# Patient Record
Sex: Male | Born: 1990 | Race: Black or African American | Hispanic: No | Marital: Single | Smoking: Never smoker
Health system: Southern US, Community
[De-identification: ages and names within clinical notes are randomized; demographics above are authoritative.]

---

## 2011-11-12 ENCOUNTER — Emergency Department (HOSPITAL_BASED_OUTPATIENT_CLINIC_OR_DEPARTMENT_OTHER)
Admission: EM | Admit: 2011-11-12 | Discharge: 2011-11-12 | Disposition: A | Discharge status: Home / Self Care | Payer: Self-pay | Attending: Emergency Medicine | Admitting: Emergency Medicine

## 2011-11-12 ENCOUNTER — Encounter (HOSPITAL_BASED_OUTPATIENT_CLINIC_OR_DEPARTMENT_OTHER): Payer: Self-pay | Admitting: Family Medicine

## 2011-11-12 DIAGNOSIS — L738 Other specified follicular disorders: Secondary | ICD-10-CM | POA: Insufficient documentation

## 2011-11-12 DIAGNOSIS — L739 Follicular disorder, unspecified: Secondary | ICD-10-CM

## 2011-11-12 MED ORDER — HYDROCODONE-ACETAMINOPHEN 5-325 MG PO TABS: 2.0000 | ORAL_TABLET | ORAL | Status: AC | PRN

## 2011-11-12 MED ORDER — CEPHALEXIN 500 MG PO CAPS: 500.0000 mg | ORAL_CAPSULE | Freq: Four times a day (QID) | ORAL | Status: AC

## 2011-11-12 NOTE — ED Notes (Signed)
Pt sts he saw a "black spider" 2 days ago and thinks he may have been bitten on leg by it. Pt has reddened area to right anterior thigh.

## 2011-11-12 NOTE — Discharge Instructions (Signed)
Folliculitis  °Folliculitis is an infection and inflammation of the hair follicles. Hair follicles become red and irritated. This inflammation is usually caused by bacteria. The bacteria thrive in warm, moist environments. This condition can be seen anywhere on the body.  °CAUSES °The most common cause of folliculitis is an infection by germs (bacteria). Fungal and viral infections can also cause the condition. Viral infections may be more common in people whose bodies are unable to fight disease well (weakened immune systems). Examples include people with: °· AIDS.  °· An organ transplant.  °· Cancer.  °People with depressed immune systems, diabetes, or obesity, have a greater risk of getting folliculitis than the general population. Certain chemicals, especially oils and tars, also can cause folliculitis. °SYMPTOMS °· An early sign of folliculitis is a small, white or yellow pus-filled, itchy lesion (pustule). These lesions appear on a red, inflamed follicle. They are usually less than 5 mm (.20 inches).  °· The most likely starting points are the scalp, thighs, legs, back and buttocks. Folliculitis is also frequently found in areas of repeated shaving.  °· When an infection of the follicle goes deeper, it becomes a boil or furuncle. A group of closely packed boils create a larger lesion (a carbuncle). These sores (lesions) tend to occur in hairy, sweaty areas of the body.  °TREATMENT  °· A doctor who specializes in skin problems (dermatologists) treats mild cases of folliculitis with antiseptic washes.  °· They also use a skin application which kills germs (topical antibiotics). Tea tree oil is a good topical antiseptic as well. It can be found at a health food store. A small percentage of individuals may develop an allergy to the tea tree oil.  °· Mild to moderate boils respond well to warm water compresses applied three times daily.  °· In some cases, oral antibiotics should be taken with the skin treatment.    °· If lesions contain large quantities of pus or fluid, your caregiver may drain them. This allows the topical antibiotics to get to the affected areas better.  °· Stubborn cases of folliculitis may respond to laser hair removal. This process uses a high intensity light beam (a laser) to destroy the follicle and reduces the scarring from folliculitis. After laser hair removal, hair will no longer grow in the laser treated area.  °Patients with long-lasting folliculitis need to find out where the infection is coming from. Germs can live in the nostrils of the patient. This can trigger an outbreak now and then. Sometimes the bacteria live in the nostrils of a family member. This person does not develop the disorder but they repeatedly re-expose others to the germ. To break the cycle of recurrence in the patient, the family member must also undergo treatment. °PREVENTION  °· Individuals who are predisposed to folliculitis should be extremely careful about personal hygiene.  °· Application of antiseptic washes may help prevent recurrences.  °· A topical antibiotic cream, mupirocin (Bactroban®), has been effective at reducing bacteria in the nostrils. It is applied inside the nose with your little finger. This is done twice daily for a week. Then it is repeated every 6 months.  °· Because follicle disorders tend to come back, patients must receive follow-up care. Your caregiver may be able to recognize a recurrence before it becomes severe.  °SEEK IMMEDIATE MEDICAL CARE IF:  °· You develop redness, swelling, or increasing pain in the area.  °· You have a fever.  °· You are not improving with treatment   or are getting worse.  °· You have any other questions or concerns.  °Document Released: 08/10/2001 Document Revised: 05/21/2011 Document Reviewed: 06/06/2008 °[employer]® Patient Information ©2012 [employer]. °

## 2011-11-12 NOTE — ED Provider Notes (Signed)
History     CSN: [id number]  Arrival date & time 11/12/11  4098   First MD Initiated Contact with Patient 11/12/11 [phone]      Chief Complaint  Patient presents with  . Insect Bite     HPI Patient presents with tender red area on the anterior aspect of right thigh.  Patient denies fever or chills.  Has no previous medical history.  Patient was concerned it might be a spider bite because he saw a spider. History reviewed. No pertinent past medical history.  History reviewed. No pertinent past surgical history.  No family history on file.  History  Substance Use Topics  . Smoking status: Never Smoker   . Smokeless tobacco: Not on file  . Alcohol Use: Yes      Review of Systems  All other systems reviewed and are negative.    Allergies  Review of patient's allergies indicates no known allergies.  Home Medications   Current Outpatient Rx  Name Route Sig Dispense Refill  . CEPHALEXIN 500 MG PO CAPS Oral Take 1 capsule (500 mg total) by mouth 4 (four) times daily. 20 capsule 0  . HYDROCODONE-ACETAMINOPHEN 5-325 MG PO TABS Oral Take 2 tablets by mouth every 4 (four) hours as needed for pain. 6 tablet 0    BP 137/85  Pulse 62  Temp(Src) 98.1 F (36.7 C) (Oral)  Resp 16  Ht 5\' 7"  (1.702 m)  Wt 170 lb (77.111 kg)  BMI 26.63 kg/m2  SpO2 99%  Physical Exam  Nursing note and vitals reviewed. Constitutional: He is oriented to person, place, and time. He appears well-developed and well-nourished. No distress.  HENT:  Head: Normocephalic and atraumatic.  Eyes: Pupils are equal, round, and reactive to light.  Neck: Normal range of motion.  Cardiovascular: Normal rate and intact distal pulses.   Pulmonary/Chest: No respiratory distress.  Abdominal: Normal appearance. He exhibits no distension.  Musculoskeletal: Normal range of motion.       Patient has an area of the right thigh which is red and tender.  At the center of the erythema there appears to be an infected  hair follicle.  That area was prepped with chlorhexidine and small amount of pertinent material was expressed using sterile needle.  It was then dressed with triple antibiotic ointment and a topical dressing.  Neurological: He is alert and oriented to person, place, and time. No cranial nerve deficit.  Skin: Skin is warm and dry. No rash noted.  Psychiatric: He has a normal mood and affect. His behavior is normal.    ED Course  Procedures (including critical care time)  Labs Reviewed - No data to display No results found.   1. Folliculitis       MDM   Patient instructed to use hot compresses several times a day for the next 2 days and return if worse.  Plan at this time is to start him on antibiotics.      Nelia Shi, MD 11/12/11 [phone]

## 2012-05-01 ENCOUNTER — Encounter (HOSPITAL_BASED_OUTPATIENT_CLINIC_OR_DEPARTMENT_OTHER): Payer: Self-pay

## 2012-05-01 ENCOUNTER — Emergency Department (HOSPITAL_BASED_OUTPATIENT_CLINIC_OR_DEPARTMENT_OTHER)
Admission: EM | Admit: 2012-05-01 | Discharge: 2012-05-01 | Disposition: A | Discharge status: Home / Self Care | Payer: Self-pay | Attending: Emergency Medicine | Admitting: Emergency Medicine

## 2012-05-01 DIAGNOSIS — R1310 Dysphagia, unspecified: Secondary | ICD-10-CM | POA: Insufficient documentation

## 2012-05-01 DIAGNOSIS — J020 Streptococcal pharyngitis: Secondary | ICD-10-CM | POA: Insufficient documentation

## 2012-05-01 MED ORDER — PREDNISONE 50 MG PO TABS
60.0000 mg | ORAL_TABLET | Freq: Once | ORAL | Status: AC
  Administered 2012-05-01: 60 mg via ORAL
  Filled 2012-05-01: qty 1

## 2012-05-01 MED ORDER — PENICILLIN V POTASSIUM 250 MG PO TABS
500.0000 mg | ORAL_TABLET | Freq: Once | ORAL | Status: AC
  Administered 2012-05-01: 500 mg via ORAL
  Filled 2012-05-01: qty 2

## 2012-05-01 MED ORDER — PENICILLIN V POTASSIUM 500 MG PO TABS: 500.0000 mg | ORAL_TABLET | Freq: Four times a day (QID) | ORAL | Status: AC

## 2012-05-01 NOTE — ED Provider Notes (Signed)
History     CSN: [id number]  Arrival date & time 05/01/12  0502   First MD Initiated Contact with Patient 05/01/12 [phone]      Chief Complaint  Patient presents with  . Sore Throat   Patient is a 21 y.o. male presenting with pharyngitis. The history is provided by the patient.  Sore Throat This is a new problem. Episode onset: just prior to arrival. The problem occurs constantly. The problem has been gradually worsening. Pertinent negatives include no chest pain and no shortness of breath. The symptoms are aggravated by swallowing. The symptoms are relieved by rest. He has tried nothing for the symptoms.  PT reports he woke up with sore throat, difficulty swallowing.  He reports he coughed and small amt of blood was expelled.  No fever.  His neck is not stiff.  He has not had this before.  He is not on medications.  No known allergic reactions.  No rash.  No SOB.    PMH - none  History reviewed. No pertinent past surgical history.  No family history on file.  History  Substance Use Topics  . Smoking status: Never Smoker   . Smokeless tobacco: Not on file  . Alcohol Use: Yes      Review of Systems  Respiratory: Negative for shortness of breath.   Cardiovascular: Negative for chest pain.    Allergies  Review of patient's allergies indicates no known allergies.  Home Medications  No current outpatient prescriptions on file.  BP 135/72  Pulse 77  Temp 98.8 F (37.1 C) (Oral)  Resp 20  SpO2 100%  Physical Exam CONSTITUTIONAL: Well developed/well nourished HEAD AND FACE: Normocephalic/atraumatic EYES: EOMI/PERRL ENMT: Mucous membranes moist. Uvula midline and it is slightly enlarged/erythematous and clearly raises away from tongue.  He has symmetric tonsillar enlargement with erythema and whitish exudates.  No stridor.  Voice is mildly hoarse but is easily understandable. No blood noted in oropharynx NECK: supple no meningeal signs. No anterior neck edema is  noted SPINE:entire spine nontender CV: S1/S2 noted, no murmurs/rubs/gallops noted LUNGS: Lungs are clear to auscultation bilaterally, no apparent distress ABDOMEN: soft, nontender, no rebound or guarding NEURO: Pt is awake/alert, moves all extremitiesx4 EXTREMITIES: pulses normal, full ROM SKIN: warm, color normal PSYCH: no abnormalities of mood noted    ED Course  Procedures   Labs Reviewed  RAPID STREP SCREEN  5:55 AM Pt able to tolerate liquids without difficulty.  No stridor is noted.  His neck is supple. He was give one dose of prednisone.  PCN was ordered for patient Do not feel further workup/imaging is warranted   MDM  Nursing notes including past medical history and social history reviewed and considered in documentation Labs/vital reviewed and considered         Joya Gaskins, MD 05/01/12 [phone]

## 2012-05-01 NOTE — ED Notes (Signed)
MD at bedside. 

## 2012-05-01 NOTE — ED Notes (Signed)
Patient reports that he awoke with sorethroat and noticed that his uvula was very swollen and coughed up a small amount of blood. Difficulty speaking on assessment but having no problems handling secretions

## 2012-07-14 ENCOUNTER — Encounter (HOSPITAL_BASED_OUTPATIENT_CLINIC_OR_DEPARTMENT_OTHER): Payer: Self-pay

## 2012-07-14 ENCOUNTER — Emergency Department (HOSPITAL_BASED_OUTPATIENT_CLINIC_OR_DEPARTMENT_OTHER)
Admission: EM | Admit: 2012-07-14 | Discharge: 2012-07-14 | Disposition: A | Discharge status: Home / Self Care | Payer: Self-pay | Attending: Emergency Medicine | Admitting: Emergency Medicine

## 2012-07-14 DIAGNOSIS — J039 Acute tonsillitis, unspecified: Secondary | ICD-10-CM

## 2012-07-14 DIAGNOSIS — R110 Nausea: Secondary | ICD-10-CM | POA: Insufficient documentation

## 2012-07-14 DIAGNOSIS — R5381 Other malaise: Secondary | ICD-10-CM | POA: Insufficient documentation

## 2012-07-14 DIAGNOSIS — J029 Acute pharyngitis, unspecified: Secondary | ICD-10-CM | POA: Insufficient documentation

## 2012-07-14 DIAGNOSIS — J069 Acute upper respiratory infection, unspecified: Secondary | ICD-10-CM | POA: Insufficient documentation

## 2012-07-14 MED ORDER — AMOXICILLIN 500 MG PO CAPS: 500.0000 mg | ORAL_CAPSULE | Freq: Three times a day (TID) | ORAL | Status: AC

## 2012-07-14 MED ORDER — HYDROCODONE-HOMATROPINE 5-1.5 MG/5ML PO SYRP: 5.0000 mL | ORAL_SOLUTION | Freq: Four times a day (QID) | ORAL | Status: AC | PRN

## 2012-07-14 NOTE — ED Notes (Signed)
Pt reports cough, chills and generalized body aches that started 3 days ago.

## 2012-07-14 NOTE — ED Provider Notes (Signed)
History     CSN: [id number]  Arrival date & time 07/14/12  1603   First MD Initiated Contact with Patient 07/14/12 1621      Chief Complaint  Patient presents with  . Cough  . Nausea  . Chills  . Generalized Body Aches    (Consider location/radiation/quality/duration/timing/severity/associated sxs/prior treatment) HPI Comments: Patient has been sick for 3 days with cough, fever, chills and malaise. He has had a sore throat, worse with swallowing. Patient reports that the body aches and a cough that kept him up at night. Cough is nonproductive. He has had some mild nausea without vomiting or diarrhea. No abdominal pain.  Patient is a 22 y.o. male presenting with cough.  Cough Associated symptoms include sore throat.    History reviewed. No pertinent past medical history.  History reviewed. No pertinent past surgical history.  History reviewed. No pertinent family history.  History  Substance Use Topics  . Smoking status: Never Smoker   . Smokeless tobacco: Not on file  . Alcohol Use: Yes     Comment: occasional      Review of Systems  Constitutional: Positive for fever and fatigue.  HENT: Positive for sore throat.   Respiratory: Positive for cough.   Gastrointestinal: Positive for nausea. Negative for abdominal pain.  All other systems reviewed and are negative.    Allergies  Review of patient's allergies indicates no known allergies.  Home Medications  No current outpatient prescriptions on file.  BP 130/90  Pulse 95  Temp 98.6 F (37 C) (Oral)  Resp 18  Ht 5\' 7"  (1.702 m)  Wt 190 lb (86.183 kg)  BMI 29.76 kg/m2  SpO2 98%  Physical Exam  Constitutional: He is oriented to person, place, and time. He appears well-developed and well-nourished. No distress.  HENT:  Head: Normocephalic and atraumatic.  Right Ear: Hearing normal.  Nose: Nose normal.  Mouth/Throat: Mucous membranes are normal. Oropharyngeal exudate present.  Eyes: Conjunctivae normal  and EOM are normal. Pupils are equal, round, and reactive to light.  Neck: Normal range of motion. Neck supple.  Cardiovascular: Normal rate, regular rhythm, S1 normal and S2 normal.  Exam reveals no gallop and no friction rub.   No murmur heard. Pulmonary/Chest: Effort normal and breath sounds normal. No respiratory distress. He exhibits no tenderness.  Abdominal: Soft. Normal appearance and bowel sounds are normal. There is no hepatosplenomegaly. There is no tenderness. There is no rebound, no guarding, no tenderness at McBurney's point and negative Murphy's sign. No hernia.  Musculoskeletal: Normal range of motion.  Lymphadenopathy:    He has cervical adenopathy.  Neurological: He is alert and oriented to person, place, and time. He has normal strength. No cranial nerve deficit or sensory deficit. Coordination normal. GCS eye subscore is 4. GCS verbal subscore is 5. GCS motor subscore is 6.  Skin: Skin is warm, dry and intact. No rash noted. No cyanosis.  Psychiatric: He has a normal mood and affect. His speech is normal and behavior is normal. Thought content normal.    ED Course  Procedures (including critical care time)  Labs Reviewed - No data to display No results found.   Diagnosis: Tonsillitis, Upper Respiratory Infection    MDM  Patient has exudative tonsillitis which will be treated.        Gilda Crease, MD 07/14/12 [phone]

## 2017-03-31 ENCOUNTER — Emergency Department (HOSPITAL_COMMUNITY)
Admission: EM | Admit: 2017-03-31 | Discharge: 2017-03-31 | Disposition: A | Discharge status: Home / Self Care | Payer: Self-pay | Attending: Emergency Medicine | Admitting: Emergency Medicine

## 2017-03-31 ENCOUNTER — Encounter (HOSPITAL_COMMUNITY): Payer: Self-pay

## 2017-03-31 DIAGNOSIS — L03115 Cellulitis of right lower limb: Secondary | ICD-10-CM | POA: Insufficient documentation

## 2017-03-31 DIAGNOSIS — Z79899 Other long term (current) drug therapy: Secondary | ICD-10-CM | POA: Insufficient documentation

## 2017-03-31 LAB — CBC
HEMATOCRIT: 41.7 % (ref 39.0–52.0)
Hemoglobin: 14 g/dL (ref 13.0–17.0)
MCH: 30.5 pg (ref 26.0–34.0)
MCHC: 33.6 g/dL (ref 30.0–36.0)
MCV: 90.8 fL (ref 78.0–100.0)
Platelets: 265 10*3/uL (ref 150–400)
RBC: 4.59 MIL/uL (ref 4.22–5.81)
RDW: 12.7 % (ref 11.5–15.5)
WBC: 9.9 10*3/uL (ref 4.0–10.5)

## 2017-03-31 LAB — BASIC METABOLIC PANEL
Anion gap: 11 (ref 5–15)
BUN: 9 mg/dL (ref 6–20)
CO2: 28 mmol/L (ref 22–32)
Calcium: 9.6 mg/dL (ref 8.9–10.3)
Chloride: 100 mmol/L — ABNORMAL LOW (ref 101–111)
Creatinine, Ser: 0.99 mg/dL (ref 0.61–1.24)
GFR calc Af Amer: >60 mL/min (ref >60)
GFR calc non Af Amer: >60 mL/min (ref >60)
GLUCOSE: 96 mg/dL (ref 65–99)
POTASSIUM: 4 mmol/L (ref 3.5–5.1)
Sodium: 139 mmol/L (ref 135–145)

## 2017-03-31 LAB — D-DIMER, QUANTITATIVE: D-Dimer, Quant: <0.27 ug/mL-FEU (ref 0.00–0.50)

## 2017-03-31 MED ORDER — DEXTROSE 5 % IV SOLN
1.0000 g | Freq: Once | INTRAVENOUS | Status: AC
  Administered 2017-03-31: 1 g via INTRAVENOUS
  Filled 2017-03-31: qty 10

## 2017-03-31 MED ORDER — CEPHALEXIN 500 MG PO CAPS: 500.0000 mg | ORAL_CAPSULE | Freq: Four times a day (QID) | ORAL | 0 refills | Status: AC

## 2017-03-31 MED ORDER — NAPROXEN 500 MG PO TABS: 500.0000 mg | ORAL_TABLET | Freq: Two times a day (BID) | ORAL | 0 refills | Status: AC

## 2017-03-31 NOTE — ED Triage Notes (Signed)
Patient states he woke yesterday morning he noticed that he had swelling and redness to the right lower leg and foot. Patient states the swelling and pain has gotten progressively worse.

## 2017-03-31 NOTE — ED Provider Notes (Signed)
[facility]-EMERGENCY DEPT Provider Note   CSN: [id number] Arrival date & time: 03/31/17  1829     History   Chief Complaint Chief Complaint  Patient presents with  . Foot Swelling  . Leg Swelling    HPI Kyle Burch is a 26 y.o. male.  HPI Patient presents to the emergency room for evaluation of foot and leg swelling. Patient states he noticed yesterday that he was developing swelling and pain in the lower portion of his right leg. His ankle and foot are swollen. He has pain in his calf. He denies any recent falls or injuries. He denies any long travel or immobility. No recent surgeries. No history of DVT or PE.Patient denies any fevers or chills. History reviewed. No pertinent past medical history.  There are no active problems to display for this patient.   History reviewed. No pertinent surgical history.     Home Medications    Prior to Admission medications   Medication Sig Start Date End Date Taking? Authorizing Provider  amoxicillin (AMOXIL) 500 MG capsule Take 1 capsule (500 mg total) by mouth 3 (three) times daily. Patient not taking: Reported on 03/31/2017 07/14/12   Gilda CreasePollina, Christopher J, MD  cephALEXin (KEFLEX) 500 MG capsule Take 1 capsule (500 mg total) by mouth 4 (four) times daily. 03/31/17   Linwood DibblesKnapp, Niclas Markell, MD  HYDROcodone-homatropine [facility](HYCODAN) 5-1.5 MG/5ML syrup Take 5 mLs by mouth every 6 (six) hours as needed for cough. Patient not taking: Reported on 03/31/2017 07/14/12   Gilda CreasePollina, Christopher J, MD  naproxen (NAPROSYN) 500 MG tablet Take 1 tablet (500 mg total) by mouth 2 (two) times daily. 03/31/17   Linwood DibblesKnapp, Doriann Zuch, MD    Family History Family History  Problem Relation Age of Onset  . Hypertension Mother     Social History Social History  Substance Use Topics  . Smoking status: Never Smoker  . Smokeless tobacco: Never Used  . Alcohol use Yes     Comment: occasional     Allergies   Patient has no known  allergies.   Review of Systems Review of Systems  All other systems reviewed and are negative.    Physical Exam Updated Vital Signs BP 115/90 (BP Location: Left Arm)   Pulse 64   Temp 99 F (37.2 C) (Oral)   Resp 20   Ht 1.727 m (5\' 8" )   Wt 93.9 kg (207 lb)   SpO2 100%   BMI 31.47 kg/m   Physical Exam  Constitutional: He appears well-developed and well-nourished. No distress.  HENT:  Head: Normocephalic and atraumatic.  Right Ear: External ear normal.  Left Ear: External ear normal.  Eyes: Conjunctivae are normal. Right eye exhibits no discharge. Left eye exhibits no discharge. No scleral icterus.  Neck: Neck supple. No tracheal deviation present.  Cardiovascular: Normal rate.   Pulmonary/Chest: Effort normal. No stridor. No respiratory distress.  Abdominal: He exhibits no distension.  Musculoskeletal: He exhibits edema and tenderness.       Right lower leg: He exhibits tenderness, swelling and edema.  Erythema on the lateral aspect of the right calf, edema of the right lower leg and foot, skin changes consistent with tinea pedis  Neurological: He is alert. Cranial nerve deficit: no gross deficits.  Skin: Skin is warm and dry. No rash noted.  Psychiatric: He has a normal mood and affect.  Nursing note and vitals reviewed.    ED Treatments / Results  Labs (all labs ordered are listed, but only abnormal results  are displayed) Labs Reviewed  BASIC METABOLIC PANEL - Abnormal; Notable for the following:       Result Value   Chloride 100 (*)    All other components within normal limits  CBC  D-DIMER, QUANTITATIVE (NOT AT [facility])      Procedures Procedures (including critical care time)  Medications Ordered in ED Medications  cefTRIAXone (ROCEPHIN) 1 g in dextrose 5 % 50 mL IVPB (1 g Intravenous New Bag/Given 03/31/17 2105)     Initial Impression / Assessment and Plan / ED Course  I have reviewed the triage vital signs and the nursing notes.  Pertinent  labs & imaging results that were available during my care of the patient were reviewed by me and considered in my medical decision making (see chart for details).   patient presented to the emergency room for evaluation of right leg swelling and redness. Patient has no history of any medical problems. No risk factors for DVT, PE. He does have erythema over his lower leg and some findings suggestive of tinea pedis that may contribute to cellulitis. His laboratory tests are reassuring. D-dimer is negative.Plan on treatment with antibiotics for presumptive cellulitis. Follow-up with the primary care doctor. Return for worsening symptoms.  Final Clinical Impressions(s) / ED Diagnoses   Final diagnoses:  Cellulitis of right lower extremity    New Prescriptions New Prescriptions   CEPHALEXIN (KEFLEX) 500 MG CAPSULE    Take 1 capsule (500 mg total) by mouth 4 (four) times daily.   NAPROXEN (NAPROSYN) 500 MG TABLET    Take 1 tablet (500 mg total) by mouth 2 (two) times daily.     Linwood Dibbles, MD 03/31/17 2148

## 2017-03-31 NOTE — Discharge Instructions (Signed)
Take the medications as prescribed, follow up with a primary care doctor in a week to make sure the symptoms are improving

## 2018-11-23 ENCOUNTER — Encounter (HOSPITAL_COMMUNITY): Payer: Self-pay | Admitting: Emergency Medicine

## 2018-11-23 ENCOUNTER — Emergency Department (HOSPITAL_COMMUNITY): Payer: No Typology Code available for payment source

## 2018-11-23 ENCOUNTER — Emergency Department (HOSPITAL_COMMUNITY)
Admission: EM | Admit: 2018-11-23 | Discharge: 2018-11-23 | Disposition: A | Discharge status: Home / Self Care | Payer: No Typology Code available for payment source | Attending: Emergency Medicine | Admitting: Emergency Medicine

## 2018-11-23 ENCOUNTER — Other Ambulatory Visit: Payer: Self-pay

## 2018-11-23 DIAGNOSIS — Y9301 Activity, walking, marching and hiking: Secondary | ICD-10-CM | POA: Diagnosis not present

## 2018-11-23 DIAGNOSIS — M25552 Pain in left hip: Secondary | ICD-10-CM | POA: Diagnosis not present

## 2018-11-23 DIAGNOSIS — Z79899 Other long term (current) drug therapy: Secondary | ICD-10-CM | POA: Diagnosis not present

## 2018-11-23 DIAGNOSIS — Y92410 Unspecified street and highway as the place of occurrence of the external cause: Secondary | ICD-10-CM | POA: Insufficient documentation

## 2018-11-23 DIAGNOSIS — M25572 Pain in left ankle and joints of left foot: Secondary | ICD-10-CM | POA: Diagnosis not present

## 2018-11-23 DIAGNOSIS — Y999 Unspecified external cause status: Secondary | ICD-10-CM | POA: Insufficient documentation

## 2018-11-23 DIAGNOSIS — R0781 Pleurodynia: Secondary | ICD-10-CM | POA: Diagnosis not present

## 2018-11-23 DIAGNOSIS — M25512 Pain in left shoulder: Secondary | ICD-10-CM | POA: Diagnosis not present

## 2018-11-23 DIAGNOSIS — R0602 Shortness of breath: Secondary | ICD-10-CM | POA: Insufficient documentation

## 2018-11-23 DIAGNOSIS — M25562 Pain in left knee: Secondary | ICD-10-CM | POA: Diagnosis not present

## 2018-11-23 MED ORDER — NAPROXEN 500 MG PO TABS
500.0000 mg | ORAL_TABLET | Freq: Once | ORAL | Status: AC
  Administered 2018-11-23: 500 mg via ORAL
  Filled 2018-11-23: qty 1

## 2018-11-23 MED ORDER — LIDOCAINE 5 % EX PTCH: 1.0000 | MEDICATED_PATCH | CUTANEOUS | 0 refills | Status: AC

## 2018-11-23 MED ORDER — LIDOCAINE 5 % EX PTCH
1.0000 | MEDICATED_PATCH | CUTANEOUS | Status: DC
  Administered 2018-11-23: 1 via TRANSDERMAL
  Filled 2018-11-23: qty 1

## 2018-11-23 MED ORDER — NAPROXEN 375 MG PO TABS: 375.0000 mg | ORAL_TABLET | Freq: Two times a day (BID) | ORAL | 0 refills | Status: AC

## 2018-11-23 MED ORDER — ACETAMINOPHEN 500 MG PO TABS
1000.0000 mg | ORAL_TABLET | Freq: Once | ORAL | Status: AC
  Administered 2018-11-23: 1000 mg via ORAL
  Filled 2018-11-23: qty 2

## 2018-11-23 NOTE — ED Notes (Signed)
Patient ambulated without assistance down hallway and back. Patient verbalized significant pain in L ankle and walked with a limp.
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
## 2018-11-23 NOTE — ED Notes (Signed)
Portable Xray at bedside.
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
## 2018-11-23 NOTE — ED Notes (Signed)
Patient transported to X-ray via stretcher 

## 2018-11-23 NOTE — ED Notes (Signed)
Provided patient with ice packs to place on shoulder, knee, and ankle. Patient states that he feels really sore. Instructed patient that medications and ice packs should help with soreness and pain. Will continue to monitor patient.

## 2020-11-05 IMAGING — CR CHEST - 2 VIEW
2 series · 2 of 2 positions shown · non-contrast
Comparison: None.

CLINICAL DATA: Shortness of breath

EXAM:
CHEST - 2 VIEW

[w chest pa]
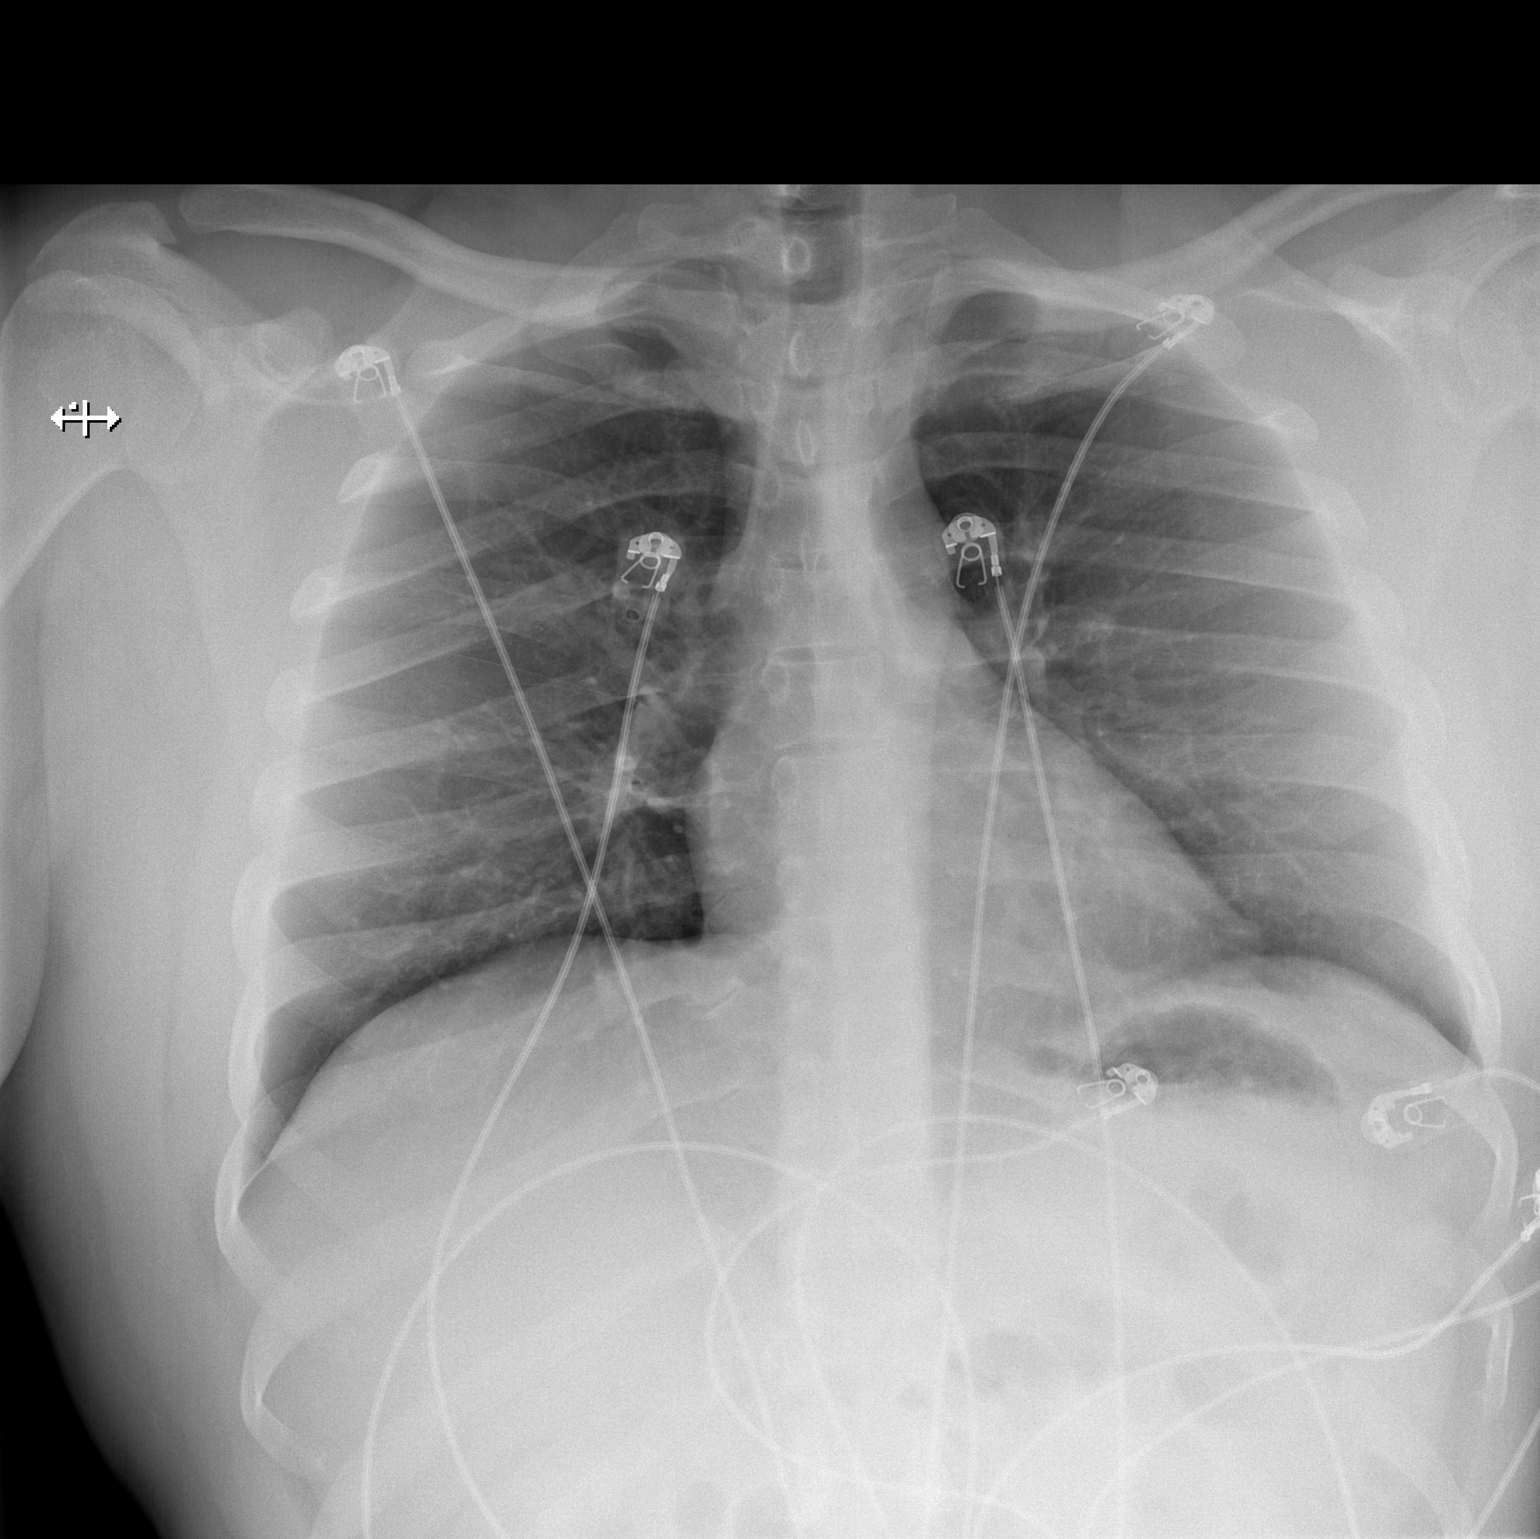

[w chest lat]
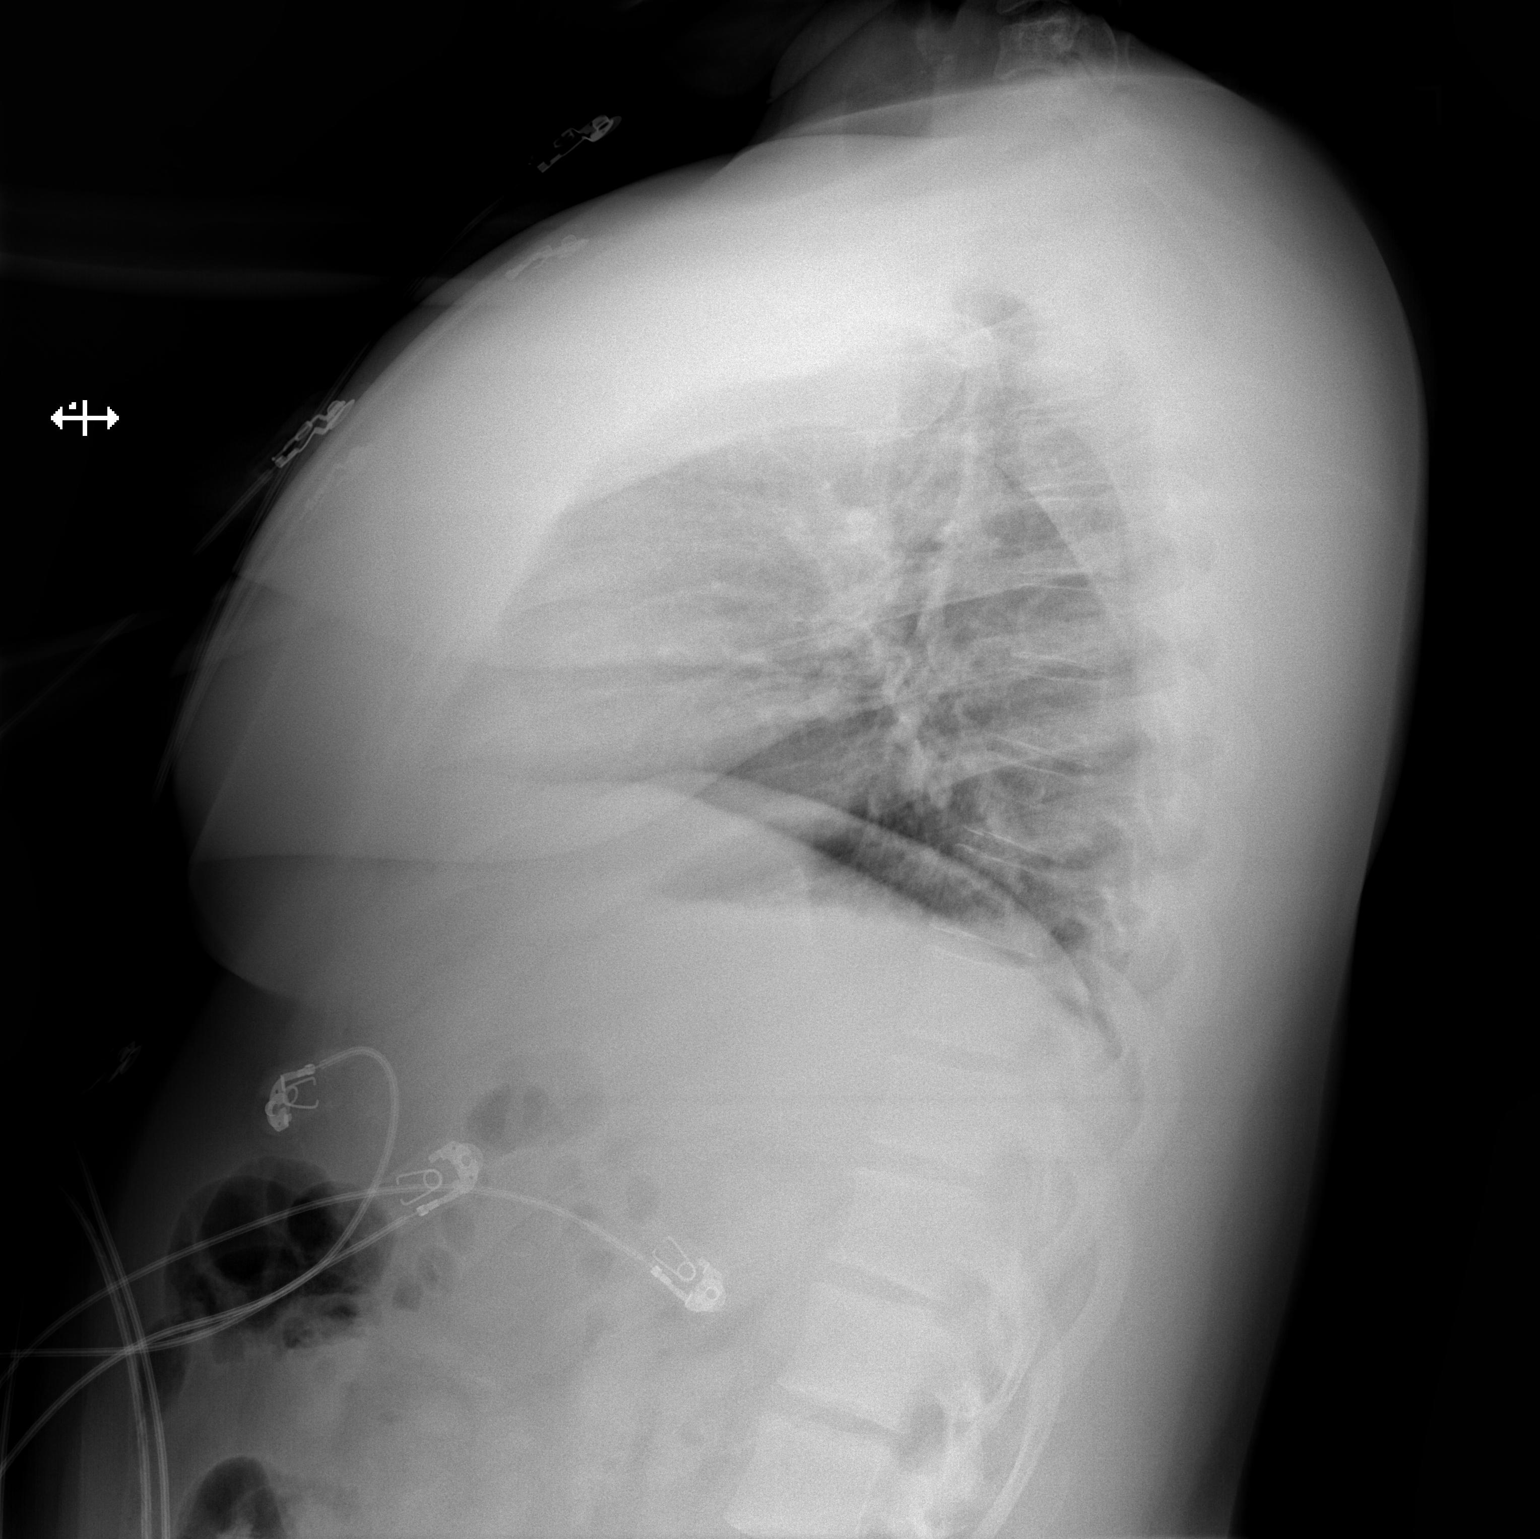

[2 of 2 positions shown; findings below may reference images not displayed]

FINDINGS: The heart size and mediastinal contours are within normal limits.
Both lungs are clear. The visualized skeletal structures are
unremarkable.
IMPRESSION: No active cardiopulmonary disease.
